# Patient Record
Sex: Female | Born: 1970 | Race: White | Hispanic: No | Marital: Married | Smoking: Never smoker
Health system: Southern US, Community
[De-identification: ages and names within clinical notes are randomized; demographics above are authoritative.]

## PROBLEM LIST (undated history)

## (undated) DIAGNOSIS — F32A Depression, unspecified: Secondary | ICD-10-CM

## (undated) DIAGNOSIS — F419 Anxiety disorder, unspecified: Secondary | ICD-10-CM

## (undated) DIAGNOSIS — A6000 Herpesviral infection of urogenital system, unspecified: Secondary | ICD-10-CM

## (undated) DIAGNOSIS — F329 Major depressive disorder, single episode, unspecified: Secondary | ICD-10-CM

## (undated) HISTORY — DX: Anxiety disorder, unspecified: F41.9

## (undated) HISTORY — DX: Herpesviral infection of urogenital system, unspecified: A60.00

## (undated) HISTORY — PX: BACK SURGERY: SHX140

## (undated) HISTORY — DX: Major depressive disorder, single episode, unspecified: F32.9

## (undated) HISTORY — DX: Depression, unspecified: F32.A

---

## 2006-06-21 HISTORY — PX: ABDOMINAL HYSTERECTOMY: SHX81

## 2014-07-09 LAB — HM PAP SMEAR: HM Pap smear: NEGATIVE

## 2014-07-30 ENCOUNTER — Other Ambulatory Visit: Payer: BLUE CROSS/BLUE SHIELD

## 2014-07-30 ENCOUNTER — Encounter: Payer: Self-pay | Admitting: General Surgery

## 2014-07-30 ENCOUNTER — Ambulatory Visit (INDEPENDENT_AMBULATORY_CARE_PROVIDER_SITE_OTHER): Payer: BLUE CROSS/BLUE SHIELD | Admitting: General Surgery

## 2014-07-30 VITALS — BP 110/60 | HR 88 | Resp 12 | Ht 63.0 in | Wt 125.0 lb

## 2014-07-30 DIAGNOSIS — N6310 Unspecified lump in the right breast, unspecified quadrant: Secondary | ICD-10-CM

## 2014-07-30 DIAGNOSIS — N63 Unspecified lump in breast: Secondary | ICD-10-CM

## 2014-07-30 NOTE — Patient Instructions (Signed)
Continue self breast exams. Call office for any new breast issues or concerns. 

## 2014-07-30 NOTE — Progress Notes (Signed)
Patient ID: Sarah Petty, female   DOB: October 14, 1970, 44 y.o.   MRN: [id number]  Chief Complaint  Patient presents with  . Breast Problem    HPI Sarah Petty is a 44 y.o. female.  who presents for a breast evaluation. The most recent mammogram was done on 2014 in [place].  Patient does perform regular self breast checks and gets regular mammograms done. No family history of breast cancer. She just moved here in August 2015 from [place]. The patient is working at the [employer] in the [place] of the [place].  She states she can feel a lump in the right breast. She first noticed it about 1-2 months ago. It has went down in size. Started as "pea" size then got as large as "marble" now back down to "pea" size.  Both breast are tender occasionally, with occasional shooting pains in the right breast. She did have a "sore" on her right nipple around 2010 that they tried to "freeze" but was unsuccessful. It has been present since that time but has not increased in size. She reports she will occasionally have tenderness in the left nipple/areola. She has inverted nipples that made nursing difficult but did not prevent her from "pumping"..  She does admit to having had mastitis during postpartum time period.  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 Anxiety   . Depression     Past Surgical History  Procedure Laterality Date  . Abdominal hysterectomy    . Back surgery  2010, 2013, 2014    No family history on file.  Social History History  Substance Use Topics  . Smoking status: Never Smoker   . Smokeless tobacco: Not on file  . Alcohol Use: No    Allergies  Allergen Reactions  . Codeine Nausea And Vomiting    Current Outpatient Prescriptions  Medication Sig Dispense Refill  . buPROPion (WELLBUTRIN XL) 150 MG 24 hr tablet Take 150 mg by mouth daily.  0  .  DULoxetine (CYMBALTA) 60 MG capsule   0  . traZODone (DESYREL) 100 MG tablet   0   No current facility-administered medications for this visit.    Review of Systems Review of Systems  Constitutional: Negative.   Respiratory: Negative.   Cardiovascular: Negative.     Blood pressure 110/60, pulse 88, resp. rate 12, height  (1.6 m), weight 125 lb (56.7 kg).  Physical Exam Physical Exam  Constitutional: She is oriented to person, place, and time. She appears well-developed and well-nourished.  Neck: Neck supple.  Cardiovascular: Normal rate, regular rhythm and normal heart sounds.   Pulmonary/Chest: Effort normal and breath sounds normal. Right breast exhibits inverted nipple and mass. Right breast exhibits no nipple discharge, no skin change and no tenderness. Left breast exhibits inverted nipple. Left breast exhibits no mass, no nipple discharge, no skin change and no tenderness.    Right areolar 3 mm nodular area.  Right breast 9 o'clock 3 CFN palpable 8 mm nodule.  Lymphadenopathy:    She has no cervical adenopathy.    She has no axillary adenopathy.  Neurological: She is alert and oriented to person, place, and time.  Skin: Skin is warm and dry.    Data Reviewed Her 2014 mammograms were reviewed. Very dense breasts are noted. No discernable abnormality.  Ultrasound examination of the right breast was completed because of the palpable  mass. At the 9:00 position 3 cm from the nipple a 0.75 x 1.22 x 1.23 soft lobulated mass with posterior acoustic enhancement was noted. This is felt to be representative of a fibroadenoma. At the 10:00 position 4 cm from the nipple a simple cyst measuring 0.61 cm in greatest diameter was noted. At the 3:00 position 4 cm from the nipple Petty additional simple cyst measuring 0.47 m in greatest diameter was noted. BI-RADS-3.  Assessment    Right breast mass, likely fibroadenoma. 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    Plan     The patient's asymptomatic in regards to the breast lesion and the option of Serzone whether she has a core biopsy to confirm the clinical impression of a fibroadenoma or undergoes excision considering a superficial location. I would recommend a punch biopsy of the chronic ulcer area due to the long-standing nature of the lesion.    Discussed right breast biopsy and excision right nipple lesion. This could be completed as Petty office procedure.  PCP: Dr. Marcello FennelHande Ref: Dr. Arvil ChacoVan Dalen   Earline MayotteByrnett, Brennyn Ortlieb W 07/31/2014, 4:37 PM

## 2014-07-31 DIAGNOSIS — N6310 Unspecified lump in the right breast, unspecified quadrant: Secondary | ICD-10-CM | POA: Insufficient documentation

## 2014-08-07 ENCOUNTER — Encounter: Payer: Self-pay | Admitting: General Surgery

## 2014-08-13 ENCOUNTER — Other Ambulatory Visit: Payer: BLUE CROSS/BLUE SHIELD

## 2014-08-13 ENCOUNTER — Ambulatory Visit (INDEPENDENT_AMBULATORY_CARE_PROVIDER_SITE_OTHER): Payer: BLUE CROSS/BLUE SHIELD | Admitting: General Surgery

## 2014-08-13 ENCOUNTER — Encounter: Payer: Self-pay | Admitting: General Surgery

## 2014-08-13 VITALS — BP 120/72 | HR 74 | Resp 14 | Ht 63.0 in | Wt 127.0 lb

## 2014-08-13 DIAGNOSIS — N6489 Other specified disorders of breast: Secondary | ICD-10-CM

## 2014-08-13 DIAGNOSIS — N6310 Unspecified lump in the right breast, unspecified quadrant: Secondary | ICD-10-CM

## 2014-08-13 DIAGNOSIS — N649 Disorder of breast, unspecified: Secondary | ICD-10-CM

## 2014-08-13 DIAGNOSIS — N63 Unspecified lump in breast: Secondary | ICD-10-CM

## 2014-08-13 HISTORY — PX: BREAST EXCISIONAL BIOPSY: SUR124

## 2014-08-13 NOTE — Progress Notes (Signed)
Patient ID: Sarah FortsMichelle P Schatzman, female   DOB: [birth year], 44 y.o.   MRN: [id number]  Chief Complaint  Patient presents with  . Procedure    right breast biopsy    HPI Sarah Petty is a 44 y.o. female.  Here today for excision of the right areolar lesion and the breast mass at the 9:00 position. HPI  Past Medical History  Diagnosis Date  . Anxiety   . Depression     Past Surgical History  Procedure Laterality Date  . Abdominal hysterectomy    . Back surgery  2010, 2013, 2014    No family history on file.  Social History History  Substance Use Topics  . Smoking status: Never Smoker   . Smokeless tobacco: Not on file  . Alcohol Use: No    Allergies  Allergen Reactions  . Codeine Nausea And Vomiting    Current Outpatient Prescriptions  Medication Sig Dispense Refill  . buPROPion (WELLBUTRIN XL) 150 MG 24 hr tablet Take 150 mg by mouth daily.  0  . DULoxetine (CYMBALTA) 60 MG capsule   0  . traZODone (DESYREL) 100 MG tablet Take 100 mg by mouth at bedtime.      No current facility-administered medications for this visit.    Review of Systems Review of Systems  Constitutional: Negative.   Respiratory: Negative.   Cardiovascular: Negative.     Blood pressure 120/72, pulse 74, resp. rate 14, height 5\' 3"  (1.6 m), weight 127 lb (57.607 kg).  Physical Exam Physical Exam These ulcerated area previously noted at the 1:00 position of the base the nipple has scabbed over. This is about 4 mm in diameter. The mass in the 9:00 position was confirmed by ultrasound prior to excision.  Assessment    Right breast fibroadenoma, chronic lesion of the right nipple.    Plan    The areas prepped with alcohol and 10 mL of 0.5% Xylocaine with 0.25% Marcaine with 1-200,000 epinephrine was utilized well tolerated. This was supplemented with 3 mL of 1% plain Xylocaine. The lesion at the 9:00 position of the right breast was excised first. A radial incision was made over the mass.  A 1 x 1 x 2 cm mass of thickened breast tissue was removed and it just medial to this a 6 mm cystic lesion excised. This was sent in formalin for routine histology. The wound was closed with 3-0 Vicryl figure-of-eight sutures to the deep layer and an intermittent layer of 4-0 nylon sutures for the skin.  Attention was turned to the area of the nipple. This was excised elliptical incision with the defect closed by interrupted 4-0 nylon sutures. Telfa and Tegaderm dressings were applied. The procedure was well tolerated. Wound care instructions were reviewed. She will follow-up in approximately 10 days with a nurse for suture removal. The delay is necessary as she is scheduled [facility] February [facility].     PCP:  Romilda JoyHande, Vishwanath  Berdie Malter W 08/14/2014, 7:00 PM

## 2014-08-13 NOTE — Patient Instructions (Signed)

## 2014-08-14 DIAGNOSIS — N649 Disorder of breast, unspecified: Secondary | ICD-10-CM | POA: Insufficient documentation

## 2014-08-16 ENCOUNTER — Telehealth: Payer: Self-pay

## 2014-08-16 NOTE — Telephone Encounter (Signed)
-----   Message from Earline MayotteJeffrey W Byrnett, MD sent at 08/16/2014  1:24 PM EST ----- Please notify the patient the skin lesion from the right areola as well as the nodule removed from the right breast were benign.   ----- Message -----    From: Lab in [facility] Interface    Sent: 08/15/2014   9:52 AM      To: Earline MayotteJeffrey W Byrnett, MD

## 2014-08-18 ENCOUNTER — Encounter: Payer: Self-pay | Admitting: General Surgery

## 2014-08-18 NOTE — Progress Notes (Signed)
Records from [facility] from 2011 through 2015 were reviewed. No notation regarding previous cryotherapy to the lesion of the right areola.  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  Rare, intermittent mention of the right nipple lesion on review of the above-mentioned records.

## 2014-08-19 NOTE — Telephone Encounter (Signed)
Notified patient as instructed, patient pleased. Discussed follow-up appointments, patient agrees  

## 2014-08-23 ENCOUNTER — Ambulatory Visit (INDEPENDENT_AMBULATORY_CARE_PROVIDER_SITE_OTHER): Payer: BLUE CROSS/BLUE SHIELD | Admitting: *Deleted

## 2014-08-23 DIAGNOSIS — N63 Unspecified lump in breast: Secondary | ICD-10-CM

## 2014-08-23 DIAGNOSIS — N6310 Unspecified lump in the right breast, unspecified quadrant: Secondary | ICD-10-CM

## 2014-08-23 NOTE — Progress Notes (Signed)
Patient here today for follow up post right breast biopsy. The sutures were removed and steri strips applied.Minimal bruising noted.  The patient is aware that a heating pad may be used for comfort as needed.  Aware of pathology. Follow up as scheduled. 

## 2016-06-21 DIAGNOSIS — A6000 Herpesviral infection of urogenital system, unspecified: Secondary | ICD-10-CM

## 2016-06-21 HISTORY — DX: Herpesviral infection of urogenital system, unspecified: A60.00

## 2016-06-22 ENCOUNTER — Encounter: Payer: Self-pay | Admitting: *Deleted

## 2016-06-22 ENCOUNTER — Emergency Department
Admission: EM | Admit: 2016-06-22 | Discharge: 2016-06-22 | Disposition: A | Discharge status: Home / Self Care | Payer: BLUE CROSS/BLUE SHIELD | Attending: Emergency Medicine | Admitting: Emergency Medicine

## 2016-06-22 DIAGNOSIS — S51811A Laceration without foreign body of right forearm, initial encounter: Secondary | ICD-10-CM | POA: Diagnosis not present

## 2016-06-22 DIAGNOSIS — Y939 Activity, unspecified: Secondary | ICD-10-CM | POA: Diagnosis not present

## 2016-06-22 DIAGNOSIS — Y929 Unspecified place or not applicable: Secondary | ICD-10-CM | POA: Diagnosis not present

## 2016-06-22 DIAGNOSIS — S6991XA Unspecified injury of right wrist, hand and finger(s), initial encounter: Secondary | ICD-10-CM | POA: Diagnosis present

## 2016-06-22 DIAGNOSIS — Z23 Encounter for immunization: Secondary | ICD-10-CM | POA: Diagnosis not present

## 2016-06-22 DIAGNOSIS — W010XXA Fall on same level from slipping, tripping and stumbling without subsequent striking against object, initial encounter: Secondary | ICD-10-CM | POA: Diagnosis not present

## 2016-06-22 DIAGNOSIS — Y999 Unspecified external cause status: Secondary | ICD-10-CM | POA: Insufficient documentation

## 2016-06-22 MED ORDER — LIDOCAINE HCL (PF) 1 % IJ SOLN
15.0000 mL | Freq: Once | INTRAMUSCULAR | Status: DC
  Filled 2016-06-22: qty 15

## 2016-06-22 MED ORDER — TETANUS-DIPHTH-ACELL PERTUSSIS 5-2.5-18.5 LF-MCG/0.5 IM SUSP
0.5000 mL | Freq: Once | INTRAMUSCULAR | Status: AC
  Administered 2016-06-22: 0.5 mL via INTRAMUSCULAR
  Filled 2016-06-22: qty 0.5

## 2016-06-22 NOTE — ED Provider Notes (Signed)
[facility] Emergency Department Provider Note  ____________________________________________  Time seen: Approximately 3:45 PM  I have reviewed the triage vital signs and the nursing notes.   HISTORY  Chief Complaint Laceration    HPI Sarah Petty is a 46 y.o. female who presents emergency department complaining of a laceration to right wrist. Patient states that she had developed a wooden lost for her sign and had an exposed nail. Patient states that she lacerated her wrist with the nail. Patient is unsure of her last tetanus shot. She reports full range of motion and sensation 5 digits of the right hand. No other injury or complaint. Bleeding was easily controlled with pressure. Patient states that she initially presented to an urgent care and was referred to the emergency department for further evaluation and management.   Past Medical History:  Diagnosis Date  . Anxiety   . Depression     Patient Active Problem List   Diagnosis Date Noted  . Nipple lesion 08/14/2014  . Breast mass, right 07/31/2014    Past Surgical History:  Procedure Laterality Date  . ABDOMINAL HYSTERECTOMY    . BACK SURGERY  2010, 2013, 2014    Prior to Admission medications   Medication Sig Start Date End Date Taking? Authorizing Provider  buPROPion (WELLBUTRIN XL) 150 MG 24 hr tablet Take 150 mg by mouth daily. 07/24/14   Historical Provider, MD  DULoxetine (CYMBALTA) 60 MG capsule  07/27/14   Historical Provider, MD  traZODone (DESYREL) 100 MG tablet Take 100 mg by mouth at bedtime.  08/12/14   Historical Provider, MD    Allergies Codeine  History reviewed. No pertinent family history.  Social History Social History  Substance Use Topics  . Smoking status: Never Smoker  . Smokeless tobacco: Not on file  . Alcohol use No     Review of Systems  Constitutional: No fever/chills Cardiovascular: no chest pain. Respiratory: no cough. No SOB. Musculoskeletal:  Negative for musculoskeletal pain. Skin: Positive for deep laceration to the right lateral wrist. Neurological: Negative for headaches, focal weakness or numbness. 10-point ROS otherwise negative.  ____________________________________________   PHYSICAL EXAM:  VITAL SIGNS: ED Triage Vitals  Enc Vitals Group     BP 06/22/16 1413 (!) 128/34     Pulse Rate 06/22/16 1413 99     Resp 06/22/16 1413 18     Temp 06/22/16 1413 97.9 F (36.6 C)     Temp Source 06/22/16 1413 Oral     SpO2 06/22/16 1413 96 %     Weight 06/22/16 1413 133 lb (60.3 kg)     Height 06/22/16 1413 5\' 3"  (1.6 m)     Head Circumference --      Peak Flow --      Pain Score 06/22/16 1414 5     Pain Loc --      Pain Edu? --      Excl. in GC? --      Constitutional: Alert and oriented. Well appearing and in no acute distress. Eyes: Conjunctivae are normal. PERRL. EOMI. Head: Atraumatic.  Cardiovascular: Normal rate, regular rhythm. Normal S1 and S2.  Good peripheral circulation. Respiratory: Normal respiratory effort without tachypnea or retractions. Lungs CTAB. Good air entry to the bases with no decreased or absent breath sounds. Musculoskeletal: Full range of motion to all extremities. No gross deformities appreciated. Neurologic:  Normal speech and language. No gross focal neurologic deficits are appreciated.  Skin:  Skin is warm, dry and intact. No  rash noted. Jagged laceration noted to the right lateral wrist. Total length is approximately 7 cm. No visible foreign body. No bleeding at this time. Full range of motion to wrist and all digits distally. Refill intact 5 digits. Sensation intact 5 digits. Psychiatric: Mood and affect are normal. Speech and behavior are normal. Patient exhibits appropriate insight and judgement.   ____________________________________________   LABS (all labs ordered are listed, but only abnormal results are displayed)  Labs Reviewed - No data to  display ____________________________________________  EKG   ____________________________________________  RADIOLOGY   No results found.  ____________________________________________    PROCEDURES  Procedure(s) performed:    Marland KitchenMarland KitchenLaceration Repair Date/Time: 06/22/2016 3:51 PM Performed by: Gala Romney D Authorized by: Gala Romney D   Consent:    Consent obtained:  Verbal   Consent given by:  Patient   Risks discussed:  Infection and pain Anesthesia (see MAR for exact dosages):    Anesthesia method:  Local infiltration   Local anesthetic:  Lidocaine 1% w/o epi Laceration details:    Location:  Shoulder/arm   Shoulder/arm location:  R lower arm   Length (cm):  7   Depth (mm):  20 Repair type:    Repair type:  Intermediate Pre-procedure details:    Preparation:  Patient was prepped and draped in usual sterile fashion Exploration:    Hemostasis achieved with:  Direct pressure   Wound exploration: wound explored through full range of motion and entire depth of wound probed and visualized     Wound extent: no foreign bodies/material noted, no muscle damage noted, no nerve damage noted and no tendon damage noted     Contaminated: no   Treatment:    Area cleansed with:  Betadine   Amount of cleaning:  Extensive   Irrigation solution:  Sterile saline   Irrigation method:  Syringe Subcutaneous repair:    Suture size:  4-0   Suture material:  Vicryl   Suture technique:  Simple interrupted   Number of sutures:  3 Skin repair:    Repair method:  Sutures   Suture size:  4-0   Suture material:  Nylon   Suture technique:  Simple interrupted   Number of sutures:  17 Approximation:    Approximation:  Close Post-procedure details:    Dressing:  Tube gauze   Patient tolerance of procedure:  Tolerated well, no immediate complications       Medications  lidocaine (PF) (XYLOCAINE) 1 % injection 15 mL (not administered)  Tdap (BOOSTRIX) injection 0.5 mL  (0.5 mLs Intramuscular Given 06/22/16 1559)     ____________________________________________   INITIAL IMPRESSION / ASSESSMENT AND PLAN / ED COURSE  Pertinent labs & imaging results that were available during my care of the patient were reviewed by me and considered in my medical decision making (see chart for details).  Review of the [place] CSRS was performed in accordance of the [facility] prior to dispensing any controlled drugs.  Clinical Course     Patient's diagnosis is consistent with Forearm laceration. This causes described above. No indication for ligamentous or muscular injury. Patient tolerated procedure well. No complications. Wound care structures are given to patient. Patient may take Tylenol and Motrin as needed for any return of pain. Patient will follow-up with primary care in 7-10 days for suture removal.. Patient is given ED precautions to return to the ED for any worsening or new symptoms.     ____________________________________________  FINAL CLINICAL IMPRESSION(S) / ED DIAGNOSES  Final diagnoses:  Laceration of right forearm, initial encounter      NEW MEDICATIONS STARTED DURING THIS VISIT:  New Prescriptions   No medications on file        This chart was dictated using voice recognition software/Dragon. Despite best efforts to proofread, errors can occur which can change the meaning. Any change was purely unintentional.    Racheal Patches, PA-C 06/22/16 1701    Arnaldo Natal, MD 06/22/16 [phone]

## 2016-06-22 NOTE — ED Notes (Signed)
Pt reports she has a laceration on her right wrist - pt states she slipped and fell on a screw causing the laceration (approx 1 1/2" laceration with depth) - bleeding is controlled at this time

## 2016-06-22 NOTE — ED Triage Notes (Signed)
States she tripped on a 2x2 and arrives with a lac on her right wrist from a nail, bleeding controlled

## 2016-06-25 ENCOUNTER — Other Ambulatory Visit: Payer: Self-pay | Admitting: Obstetrics and Gynecology

## 2016-06-25 DIAGNOSIS — N63 Unspecified lump in unspecified breast: Secondary | ICD-10-CM

## 2016-07-05 ENCOUNTER — Inpatient Hospital Stay
Admission: RE | Admit: 2016-07-05 | Discharge: 2016-07-05 | Disposition: A | Discharge status: Home / Self Care | Payer: Self-pay | Source: Ambulatory Visit | Attending: *Deleted | Admitting: *Deleted

## 2016-07-05 ENCOUNTER — Other Ambulatory Visit: Payer: Self-pay | Admitting: *Deleted

## 2016-07-05 DIAGNOSIS — Z9289 Personal history of other medical treatment: Secondary | ICD-10-CM

## 2016-07-16 ENCOUNTER — Ambulatory Visit
Admission: RE | Admit: 2016-07-16 | Discharge: 2016-07-16 | Disposition: A | Discharge status: Home / Self Care | Payer: BLUE CROSS/BLUE SHIELD | Source: Ambulatory Visit | Attending: Obstetrics and Gynecology | Admitting: Obstetrics and Gynecology

## 2016-07-16 DIAGNOSIS — N63 Unspecified lump in unspecified breast: Secondary | ICD-10-CM

## 2016-07-16 DIAGNOSIS — N6321 Unspecified lump in the left breast, upper outer quadrant: Secondary | ICD-10-CM | POA: Diagnosis not present

## 2016-07-16 LAB — HM MAMMOGRAPHY

## 2016-08-04 ENCOUNTER — Other Ambulatory Visit: Payer: Self-pay | Admitting: Obstetrics and Gynecology

## 2016-08-04 DIAGNOSIS — N6321 Unspecified lump in the left breast, upper outer quadrant: Secondary | ICD-10-CM

## 2016-12-08 ENCOUNTER — Encounter: Payer: Self-pay | Admitting: Obstetrics and Gynecology

## 2016-12-08 ENCOUNTER — Ambulatory Visit (INDEPENDENT_AMBULATORY_CARE_PROVIDER_SITE_OTHER): Payer: BLUE CROSS/BLUE SHIELD | Admitting: Obstetrics and Gynecology

## 2016-12-08 VITALS — BP 138/80 | HR 74 | Ht 63.0 in | Wt 129.0 lb

## 2016-12-08 DIAGNOSIS — B9689 Other specified bacterial agents as the cause of diseases classified elsewhere: Secondary | ICD-10-CM

## 2016-12-08 DIAGNOSIS — N760 Acute vaginitis: Secondary | ICD-10-CM | POA: Diagnosis not present

## 2016-12-08 LAB — POCT WET PREP WITH KOH
Clue Cells Wet Prep HPF POC: POSITIVE
KOH PREP POC: POSITIVE — AB
TRICHOMONAS UA: NEGATIVE
YEAST WET PREP PER HPF POC: NEGATIVE

## 2016-12-08 MED ORDER — METRONIDAZOLE 500 MG PO TABS: ORAL_TABLET | ORAL | 0 refills | Status: DC

## 2016-12-08 NOTE — Progress Notes (Signed)
Chief Complaint  Patient presents with  . Vaginitis    vaginal odor x 1 week    HPI:      [name] is a 46 y.o. G2P1011 who LMP was No LMP recorded. Patient has had a hysterectomy., presents today for vaginal odor without increased d/c/ irritation for the past wk. She denies any LBP, belly pain, fevers, urin sx. No recent abx use. She has not used any meds to treat. No hx of BV in the past.     Patient Active Problem List   Diagnosis Date Noted  . Nipple lesion 08/14/2014  . Breast mass, right 07/31/2014    Family History  Problem Relation Age of Onset  . Hypertension Mother     Social History   Social History  . Marital status: Married    Spouse name: N/A  . Number of children: N/A  . Years of education: 9616   Occupational History  . Not on file.   Social History Main Topics  . Smoking status: Never Smoker  . Smokeless tobacco: Never Used  . Alcohol use No  . Drug use: No  . Sexual activity: Yes    Birth control/ protection: Surgical     Comment: hysterectomy   Other Topics Concern  . Not on file   Social History Narrative  . No narrative on file     Current Outpatient Prescriptions:  .  buPROPion (WELLBUTRIN XL) 150 MG 24 hr tablet, Take 150 mg by mouth daily., Disp: , Rfl: 0 .  DULoxetine (CYMBALTA) 60 MG capsule, , Disp: , Rfl: 0 .  meloxicam (MOBIC) 7.5 MG tablet, take 1 tablet by mouth once daily with food or milk, Disp: , Rfl: 0 .  metroNIDAZOLE (FLAGYL) 500 MG tablet, Take 2 tabs BID for 1 day, Disp: 4 tablet, Rfl: 0 .  traZODone (DESYREL) 100 MG tablet, Take 100 mg by mouth at bedtime. , Disp: , Rfl:   Review of Systems  Constitutional: Negative for fever.  Gastrointestinal: Negative for blood in stool, constipation, diarrhea, nausea and vomiting.  Genitourinary: Negative for dyspareunia, dysuria, flank pain, frequency, hematuria, urgency, vaginal bleeding, vaginal discharge and vaginal pain.  Musculoskeletal: Negative for back  pain.  Skin: Negative for rash.     OBJECTIVE:   Vitals:  BP 138/80 (BP Location: Left Arm, Patient Position: Sitting, Cuff Size: Normal)   Pulse 74   Ht 5\' 3"  (1.6 m)   Wt 129 lb (58.5 kg)   BMI 22.85 kg/m   Physical Exam  Constitutional: She is oriented to person, place, and time and well-developed, well-nourished, and in no distress. Vital signs are normal.  Genitourinary: Uterus normal, cervix normal, right adnexa normal, left adnexa normal and vulva normal. Uterus is not enlarged. Cervix exhibits no motion tenderness and no tenderness. Right adnexum displays no mass and no tenderness. Left adnexum displays no mass and no tenderness. Vulva exhibits no erythema, no exudate, no lesion, no rash and no tenderness. Vagina exhibits no lesion. Thin  acrid  white and vaginal discharge found.  Neurological: She is oriented to person, place, and time.  Vitals reviewed.   Results: Results for orders placed or performed in visit on 12/08/16 (from the past 24 hour(s))  POCT Wet Prep with KOH     Status: Abnormal   Collection Time: 12/08/16 11:42 AM  Result Value Ref Range   Trichomonas, UA Negative    Clue Cells Wet Prep HPF POC pos    Epithelial Wet  Prep HPF POC  Few, Moderate, Many, Too numerous to count   Yeast Wet Prep HPF POC neg    Bacteria Wet Prep HPF POC  Few   RBC Wet Prep HPF POC     WBC Wet Prep HPF POC     KOH Prep POC Positive (A) Negative     Assessment/Plan: Bacterial vaginosis - Pos wet prep. Rx flagyl. Will RF if sx recur. F/u prn. - Plan: POCT Wet Prep with KOH, metroNIDAZOLE (FLAGYL) 500 MG tablet     Return if symptoms worsen or fail to improve.  Edel Rivero B. Kihanna Kamiya, PA-C 12/08/2016 11:43 AM

## 2016-12-20 ENCOUNTER — Telehealth: Payer: Self-pay

## 2016-12-20 ENCOUNTER — Telehealth: Payer: Self-pay | Admitting: Certified Nurse Midwife

## 2016-12-20 ENCOUNTER — Other Ambulatory Visit: Payer: Self-pay | Admitting: Certified Nurse Midwife

## 2016-12-20 MED ORDER — FLUCONAZOLE 150 MG PO TABS: ORAL_TABLET | ORAL | 0 refills | Status: DC

## 2016-12-20 MED ORDER — TINIDAZOLE 500 MG PO TABS: 1.0000 g | ORAL_TABLET | Freq: Every day | ORAL | 0 refills | Status: AC

## 2016-12-20 NOTE — Telephone Encounter (Signed)
Patient called regarding her return of BV symptoms. Was treated for BV with a one day course of Flagyl. Will try longer treatment. Tindamax 1gm daily x 5 days with food. No alcohol x 7 days. Diflucan also prescribed to prevent yeast.

## 2016-12-20 NOTE — Telephone Encounter (Signed)
Pt aware msg sent to [facility] as [facility] is out of town and unable to [employer] msgs. 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  Adv to change underwear 2-3x/d and be sure underwear has cotton crotch.  Pt aware her phone # given to [facility] in msg.

## 2016-12-20 NOTE — Telephone Encounter (Signed)
Pt had BV, given rx, it went away, yeast inf, tx'd, now believes BV is back b/c of the odor.  Would like rx for both.  [phone].

## 2016-12-20 NOTE — Telephone Encounter (Signed)
Patient called and RX e prescribed.

## 2017-01-17 ENCOUNTER — Ambulatory Visit: Payer: BLUE CROSS/BLUE SHIELD

## 2017-04-11 ENCOUNTER — Ambulatory Visit (INDEPENDENT_AMBULATORY_CARE_PROVIDER_SITE_OTHER): Payer: BLUE CROSS/BLUE SHIELD | Admitting: Obstetrics and Gynecology

## 2017-04-11 ENCOUNTER — Encounter: Payer: Self-pay | Admitting: Obstetrics and Gynecology

## 2017-04-11 DIAGNOSIS — N6320 Unspecified lump in the left breast, unspecified quadrant: Secondary | ICD-10-CM

## 2017-04-11 NOTE — Progress Notes (Addendum)
Chief Complaint  Patient presents with  . Breast Problem    Left Breast Lump has gotten larger    HPI:      [name] is a 46 y.o. G2P1011 who LMP was No LMP recorded. Patient has had a hysterectomy., presents today for f/u LT breast mass. She first noticed it 1/18 and was noted to have a probable LT breast fibroadenoma vs complicated cyst at 1:00 pos on mammo and u/s. Pt was to have f/u LT breast u/s 7/18 but never did. Pt has noted the mass has gotten bigger and easier to feel for the past 2-3 wks. No pain, redness, tenderness. She drinks caffeine daily. No trauma to the breast. No FH breast/ovar ca. She had a RT breast mass exc with Dr. Lemar Livings in the past.     Past Medical History:  Diagnosis Date  . Anxiety   . Depression   . 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 ABDOMINAL HYSTERECTOMY  2008  . BACK SURGERY  2010, 2013, 2014  . BREAST EXCISIONAL BIOPSY Right 08/13/2014   complex cyst excised done by byrnett     Family History  Problem Relation Age of Onset  . Hypertension Mother     Social History   Social History  . Marital status: Married    Spouse name: N/A  . Number of children: N/A  . Years of education: 36   Occupational History  . Not on file.   Social History Main Topics  . Smoking status: Never Smoker  . Smokeless tobacco: Never Used  . Alcohol use No  . Drug use: No  . Sexual activity: Yes    Birth control/ protection: Surgical     Comment: hysterectomy   Other Topics Concern  . Not on file   Social History Narrative  . No narrative on file     Current Outpatient Prescriptions:  .  buPROPion (WELLBUTRIN XL) 150 MG 24 hr tablet, Take 150 mg by mouth daily., Disp: , Rfl: 0 .  traZODone (DESYREL) 100 MG tablet, Take 100 mg by mouth at bedtime. , Disp: , Rfl:  .  DULoxetine (CYMBALTA) 60 MG capsule, , Disp: , Rfl: 0 .  fluconazole (DIFLUCAN) 150 MG tablet, Take one  tablet every 3 days x 2 doses (Patient not taking: Reported on 04/11/2017), Disp: 2 tablet, Rfl: 0 .  meloxicam (MOBIC) 7.5 MG tablet, take 1 tablet by mouth once daily with food or milk, Disp: , Rfl: 0 .  metroNIDAZOLE (FLAGYL) 500 MG tablet, Take 2 tabs BID for 1 day (Patient not taking: Reported on 04/11/2017), Disp: 4 tablet, Rfl: 0   ROS:  Review of Systems  Constitutional: Negative for fever.  Gastrointestinal: Negative for blood in stool, constipation, diarrhea, nausea and vomiting.  Genitourinary: Negative for dyspareunia, dysuria, flank pain, frequency, hematuria, urgency, vaginal bleeding, vaginal discharge and vaginal pain.  Musculoskeletal: Negative for back pain.  Skin: Negative for rash.  Breast ROS: positive for - new or changing breast lumps    OBJECTIVE:   Vitals:  BP 118/80 (BP Location: Left Arm, Patient Position: Sitting, Cuff Size: Normal)   Pulse 88   Ht 5\' 3"  (1.6 m)   Wt 128 lb (58.1 kg)   BMI 22.67 kg/m   Physical Exam  Constitutional: She is oriented to person, place, and time and well-developed, well-nourished, and in no distress.  Pulmonary/Chest: Right breast  exhibits inverted nipple. Right breast exhibits no mass, no nipple discharge, no skin change and no tenderness. Left breast exhibits inverted nipple and mass. Left breast exhibits no nipple discharge, no skin change and no tenderness. Breasts are symmetrical.    LT BREAT 1-2:00 POS WITH ~1 CM FIRM, MOBILE, NT MASS; PALPABLE BEST IN SITTING POSITION  Lymphadenopathy:    She has no axillary adenopathy.  Neurological: She is alert and oriented to person, place, and time.  Psychiatric: Affect and judgment normal.  Vitals reviewed.   Assessment/Plan: Left breast mass - Increasing in size per pt report. Check f/u breast u/s. Sched appt with Dr. Lemar LivingsByrnett for 2nd opinion anyway. - Plan: US BREAST LTD UNI LEFT INC AXILLA, Ambulatory referral to General Surgery, MM DIAG BREAST TOMO UNI LEFT  Pt loses  health ins 06/20/17.   Return if symptoms worsen or fail to improve.  Brittnay Pigman B. Favor Hackler, PA-C 04/12/2017 9:56 AM

## 2017-04-12 NOTE — Addendum Note (Signed)
Addended by: Althea GrimmerOPLAND, Trentan Trippe B on: 04/12/2017 09:57 AM   Modules accepted: Orders

## 2017-04-14 ENCOUNTER — Encounter: Payer: Self-pay | Admitting: *Deleted

## 2017-04-18 ENCOUNTER — Ambulatory Visit
Admission: RE | Admit: 2017-04-18 | Discharge: 2017-04-18 | Disposition: A | Discharge status: Home / Self Care | Payer: BLUE CROSS/BLUE SHIELD | Source: Ambulatory Visit | Attending: Obstetrics and Gynecology | Admitting: Obstetrics and Gynecology

## 2017-04-18 DIAGNOSIS — N6321 Unspecified lump in the left breast, upper outer quadrant: Secondary | ICD-10-CM | POA: Diagnosis not present

## 2017-04-18 DIAGNOSIS — N6320 Unspecified lump in the left breast, unspecified quadrant: Secondary | ICD-10-CM | POA: Diagnosis present

## 2017-04-20 ENCOUNTER — Ambulatory Visit: Payer: BLUE CROSS/BLUE SHIELD | Admitting: General Surgery

## 2017-05-04 ENCOUNTER — Encounter: Payer: Self-pay | Admitting: *Deleted

## 2017-06-09 ENCOUNTER — Emergency Department: Payer: BLUE CROSS/BLUE SHIELD

## 2017-06-09 ENCOUNTER — Other Ambulatory Visit: Payer: Self-pay

## 2017-06-09 ENCOUNTER — Emergency Department
Admission: EM | Admit: 2017-06-09 | Discharge: 2017-06-09 | Disposition: A | Discharge status: Home / Self Care | Payer: BLUE CROSS/BLUE SHIELD | Attending: Emergency Medicine | Admitting: Emergency Medicine

## 2017-06-09 DIAGNOSIS — N83201 Unspecified ovarian cyst, right side: Secondary | ICD-10-CM | POA: Insufficient documentation

## 2017-06-09 DIAGNOSIS — Z79899 Other long term (current) drug therapy: Secondary | ICD-10-CM | POA: Diagnosis not present

## 2017-06-09 DIAGNOSIS — N83209 Unspecified ovarian cyst, unspecified side: Secondary | ICD-10-CM

## 2017-06-09 DIAGNOSIS — R1031 Right lower quadrant pain: Secondary | ICD-10-CM | POA: Diagnosis present

## 2017-06-09 LAB — CBC
HCT: 43.6 % (ref 35.0–47.0)
Hemoglobin: 14.9 g/dL (ref 12.0–16.0)
MCH: 32.4 pg (ref 26.0–34.0)
MCHC: 34.2 g/dL (ref 32.0–36.0)
MCV: 94.7 fL (ref 80.0–100.0)
Platelets: 312 10*3/uL (ref 150–440)
RBC: 4.61 MIL/uL (ref 3.80–5.20)
RDW: 13 % (ref 11.5–14.5)
WBC: 10.2 10*3/uL (ref 3.6–11.0)

## 2017-06-09 LAB — COMPREHENSIVE METABOLIC PANEL
ALT: 12 U/L — ABNORMAL LOW (ref 14–54)
AST: 27 U/L (ref 15–41)
Albumin: 4.5 g/dL (ref 3.5–5.0)
Alkaline Phosphatase: 80 U/L (ref 38–126)
Anion gap: 13 (ref 5–15)
BUN: 14 mg/dL (ref 6–20)
CO2: 21 mmol/L — ABNORMAL LOW (ref 22–32)
Calcium: 9.7 mg/dL (ref 8.9–10.3)
Chloride: 101 mmol/L (ref 101–111)
Creatinine, Ser: 0.78 mg/dL (ref 0.44–1.00)
GFR calc Af Amer: >60 mL/min (ref >60)
GFR calc non Af Amer: >60 mL/min (ref >60)
Glucose, Bld: 107 mg/dL — ABNORMAL HIGH (ref 65–99)
Potassium: 3.7 mmol/L (ref 3.5–5.1)
Sodium: 135 mmol/L (ref 135–145)
Total Bilirubin: 0.9 mg/dL (ref 0.3–1.2)
Total Protein: 7.8 g/dL (ref 6.5–8.1)

## 2017-06-09 LAB — URINALYSIS, COMPLETE (UACMP) WITH MICROSCOPIC
Bacteria, UA: NONE SEEN
Bilirubin Urine: NEGATIVE
Glucose, UA: NEGATIVE mg/dL
Hgb urine dipstick: NEGATIVE
Ketones, ur: 20 mg/dL — AB
Leukocytes, UA: NEGATIVE
Nitrite: NEGATIVE
Protein, ur: NEGATIVE mg/dL
Specific Gravity, Urine: 1.011 (ref 1.005–1.030)
pH: 9 — ABNORMAL HIGH (ref 5.0–8.0)

## 2017-06-09 LAB — POCT PREGNANCY, URINE: Preg Test, Ur: NEGATIVE

## 2017-06-09 LAB — LIPASE, BLOOD: LIPASE: 28 U/L (ref 11–51)

## 2017-06-09 MED ORDER — FENTANYL CITRATE (PF) 100 MCG/2ML IJ SOLN
INTRAMUSCULAR | Status: AC
  Administered 2017-06-09: 50 ug via INTRAVENOUS
  Filled 2017-06-09: qty 2

## 2017-06-09 MED ORDER — IBUPROFEN 600 MG PO TABS: 600.0000 mg | ORAL_TABLET | Freq: Four times a day (QID) | ORAL | 0 refills | Status: DC | PRN

## 2017-06-09 MED ORDER — ONDANSETRON HCL 4 MG/2ML IJ SOLN
4.0000 mg | Freq: Once | INTRAMUSCULAR | Status: AC
  Administered 2017-06-09: 4 mg via INTRAVENOUS

## 2017-06-09 MED ORDER — IOPAMIDOL (ISOVUE-300) INJECTION 61%
75.0000 mL | Freq: Once | INTRAVENOUS | Status: AC | PRN
  Administered 2017-06-09: 75 mL via INTRAVENOUS

## 2017-06-09 MED ORDER — SODIUM CHLORIDE 0.9 % IV BOLUS (SEPSIS)
1000.0000 mL | Freq: Once | INTRAVENOUS | Status: AC
  Administered 2017-06-09: 1000 mL via INTRAVENOUS

## 2017-06-09 MED ORDER — ONDANSETRON HCL 4 MG/2ML IJ SOLN
INTRAMUSCULAR | Status: AC
  Administered 2017-06-09: 4 mg via INTRAVENOUS
  Filled 2017-06-09: qty 2

## 2017-06-09 MED ORDER — MORPHINE SULFATE (PF) 4 MG/ML IV SOLN
4.0000 mg | Freq: Once | INTRAVENOUS | Status: AC
  Administered 2017-06-09: 4 mg via INTRAVENOUS

## 2017-06-09 MED ORDER — METOCLOPRAMIDE HCL 5 MG/ML IJ SOLN
10.0000 mg | Freq: Once | INTRAMUSCULAR | Status: AC
  Administered 2017-06-09: 10 mg via INTRAVENOUS
  Filled 2017-06-09: qty 2

## 2017-06-09 MED ORDER — MORPHINE SULFATE (PF) 4 MG/ML IV SOLN
INTRAVENOUS | Status: AC
  Filled 2017-06-09: qty 1

## 2017-06-09 MED ORDER — FENTANYL CITRATE (PF) 100 MCG/2ML IJ SOLN
50.0000 ug | Freq: Once | INTRAMUSCULAR | Status: AC
  Administered 2017-06-09: 50 ug via INTRAVENOUS

## 2017-06-09 NOTE — ED Notes (Signed)
Pt back from US

## 2017-06-09 NOTE — ED Provider Notes (Signed)
[facility] Emergency Department Provider Note ____________________________________________   First MD Initiated Contact with Patient 06/09/17 1927     (approximate)  I have reviewed the triage vital signs and the nursing notes.   HISTORY  Chief Complaint Abdominal Pain (RLQ)    HPI Sarah Petty is a 46 y.o. female with past medical history as noted below and who is status post hysterectomy who presents with acute onset of right lower quadrant abdominal pain approximately 12 hours ago, with persistent course throughout the day, associated with nausea but no vomiting, and not associated with urinary symptoms, fever, vaginal bleeding or discharge, or diarrhea.  No prior history of this pain.  Patient states she is sexually active and has had multiple partners over the last few months, however she states she is diligent about using protection, and has had no discharge.    Past Medical History:  Diagnosis Date  . Anxiety   . Depression   . 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 Left breast mass 04/11/2017  . Nipple lesion 08/14/2014  . Breast mass, right 07/31/2014    Past Surgical History:  Procedure Laterality Date  . ABDOMINAL HYSTERECTOMY  2008  . BACK SURGERY  2010, 2013, 2014  . BREAST EXCISIONAL BIOPSY Right 08/13/2014   complex cyst excised done by byrnett     Prior to Admission medications   Medication Sig Start Date End Date Taking? Authorizing Provider  buPROPion (WELLBUTRIN XL) 150 MG 24 hr tablet Take 150 mg by mouth daily. 07/24/14   [provider]  DULoxetine (CYMBALTA) 60 MG capsule  07/27/14   [provider]  fluconazole (DIFLUCAN) 150 MG tablet Take one tablet every 3 days x 2 doses Patient not taking: Reported on 04/11/2017 12/20/16   Farrel ConnersGutierrez, Colleen, CNM  meloxicam (MOBIC) 7.5 MG tablet take 1 tablet by mouth once daily with food or milk  11/29/16   [provider]  metroNIDAZOLE (FLAGYL) 500 MG tablet Take 2 tabs BID for 1 day Patient not taking: Reported on 04/11/2017 12/08/16   Copland, Ilona SorrelAlicia B, PA-C  traZODone (DESYREL) 100 MG tablet Take 100 mg by mouth at bedtime.  08/12/14   [provider]    Allergies Codeine  Family History  Problem Relation Age of Onset  . Hypertension Mother   . Breast cancer Neg Hx     Social History Social History   Tobacco Use  . Smoking status: Never Smoker  . Smokeless tobacco: Never Used  Substance Use Topics  . Alcohol use: No    Alcohol/week: 0.0 oz  . Drug use: No    Review of Systems  Constitutional: No fever.  Eyes: No visual changes. ENT: No sore throat. Cardiovascular: Denies chest pain. Respiratory: Denies shortness of breath. Gastrointestinal: Positive for nausea, no vomiting. Genitourinary: Negative for dysuria.  Musculoskeletal: Negative for back pain. Skin: Negative for rash. Neurological: Negative for headache.    ____________________________________________   PHYSICAL EXAM:  VITAL SIGNS: ED Triage Vitals  Enc Vitals Group     BP 06/09/17 1641 124/60     Pulse Rate 06/09/17 1641 80     Resp 06/09/17 1641 (!) 22     Temp 06/09/17 1641 98 F (36.7 C)     Temp Source 06/09/17 1641 Oral     SpO2 06/09/17 1641 99 %     Weight 06/09/17 1642 128 lb (58.1  kg)     Height 06/09/17 1642 5\' 3"  (1.6 m)     Head Circumference --      Peak Flow --      Pain Score 06/09/17 1641 7     Pain Loc --      Pain Edu? --      Excl. in GC? --     Constitutional: Alert and oriented.  Slightly uncomfortable appearing but in no acute distress. Eyes: Conjunctivae are normal.  No scleral icterus. Head: Atraumatic. Nose: No congestion/rhinnorhea. Mouth/Throat: Mucous membranes are moist.   Neck: Normal range of motion.  Cardiovascular:  Good peripheral circulation. Respiratory: Normal respiratory effort.  No retractions.  Gastrointestinal: Soft  with moderate right lower quadrant tenderness. No distention.  Genitourinary: No CVA tenderness. Musculoskeletal: No lower extremity edema.  Extremities warm and well perfused.  Neurologic:  Normal speech and language. No gross focal neurologic deficits are appreciated.  Skin:  Skin is warm and dry. No rash noted. Psychiatric: Mood and affect are normal. Speech and behavior are normal.  ____________________________________________   LABS (all labs ordered are listed, but only abnormal results are displayed)  Labs Reviewed  COMPREHENSIVE METABOLIC PANEL - Abnormal; Notable for the following components:      Result Value   CO2 21 (*)    Glucose, Bld 107 (*)    ALT 12 (*)    All other components within normal limits  URINALYSIS, COMPLETE (UACMP) WITH MICROSCOPIC - Abnormal; Notable for the following components:   Color, Urine YELLOW (*)    APPearance CLEAR (*)    pH 9.0 (*)    Ketones, ur 20 (*)    Squamous Epithelial / LPF 0-5 (*)    All other components within normal limits  LIPASE, BLOOD  CBC  POC URINE PREG, ED  POCT PREGNANCY, URINE   ____________________________________________  EKG   ____________________________________________  RADIOLOGY  CT abdomen: Small right ovarian cyst with free fluid, suggestive of ruptured cyst.  Normal appendix.  Pelvic ultrasound:  ____________________________________________   PROCEDURES  Procedure(s) performed: No    Critical Care performed: No ____________________________________________   INITIAL IMPRESSION / ASSESSMENT AND PLAN / ED COURSE  Pertinent labs & imaging results that were available during my care of the patient were reviewed by me and considered in my medical decision making (see chart for details).  46 year old female with past medical history as noted above presents with acute onset right lower quadrant pain for approximately last 12 hours with nausea but no significant associated symptoms.  On exam,  vital signs are normal, the patient is slightly uncomfortable but not ill-appearing, and there is moderate tenderness in the right lower quadrant.  Past medical records were reviewed in epic and are noncontributory.  Differential includes acute appendicitis, colitis, diverticulitis, or pelvic etiology such as ruptured ovarian cyst or less likely ovarian torsion.  Given the patient is status post hysterectomy, there is no possibility of fibroid or PID.  I offered to perform a pelvic exam if patient would prefer, however she declined.  ----------------------------------------- 7:56 PM on 06/09/2017 -----------------------------------------  CT shows normal appendix, and reveals findings suggestive of ruptured ovarian cyst.  I will obtain a pelvic ultrasound to confirm and rule out any additional pathology in the pelvis.  Anticipate discharge home if the ultrasound confirms no other acute findings.    ----------------------------------------- 9:19 PM on 06/09/2017 -----------------------------------------  Ultrasound confirms findings consistent with ruptured ovarian cyst, with no other acute findings.  Patient reports improved pain, and appears  much more comfortable.  Vital signs are stable.  The patient feels well and would like to go home.  Discharge instructions and return precautions given, patient expresses understanding.  ____________________________________________   FINAL CLINICAL IMPRESSION(S) / ED DIAGNOSES  Final diagnoses:  Ovarian cyst  Ruptured ovarian cyst      NEW MEDICATIONS STARTED DURING THIS VISIT:  This SmartLink is deprecated. Use AVSMEDLIST instead to display the medication list for a patient.   Note:  This document was prepared using Dragon voice recognition software and may include unintentional dictation errors.    Dionne Bucy, MD 06/09/17 2119

## 2017-06-09 NOTE — ED Notes (Signed)
Patient transported to Ultrasound 

## 2017-06-09 NOTE — ED Notes (Signed)
  Esign not working. Pt verbalized discharge instructions and has no questions at this time. 

## 2017-06-09 NOTE — Discharge Instructions (Signed)
Return to the emergency department for new, worsening, or persistent severe abdominal pain, vomiting, weakness, lightheadedness, or fever, or any other new or worsening symptoms that concern you.

## 2017-06-09 NOTE — ED Triage Notes (Signed)
Pt arrives to ED with c/o of RLQ abd pain that began at 6am today. States nausea. Appears uncomfortable. No vomiting, diarrhea, or fevers. Alert oriented, in wheelchair.

## 2018-01-23 IMAGING — CT CT ABD-PELV W/ CM
2 of 5 series · 16 of 46 positions shown, 18 images · IV contrast (iopamidol)
Comparison: None

CLINICAL DATA: RIGHT lower quadrant pain since 8988 hours today,
nausea, question appendicitis

EXAM:
CT ABDOMEN AND PELVIS WITH CONTRAST
TECHNIQUE: Multidetector CT imaging of the abdomen and pelvis was performed
using the standard protocol following bolus administration of
intravenous contrast. Sagittal and coronal MPR images reconstructed
from axial data set.
CONTRAST:  75mL WBP2RD-3YY IOPAMIDOL (WBP2RD-3YY) INJECTION 61% IV.
No oral contrast.

[Series 2: axial (person_name) (person_name) · axial · 0.61mm/px · z∈[-660,-276]mm · 13 of 87 slices shown, 15 images]
[im 5/87  soft-tissue]
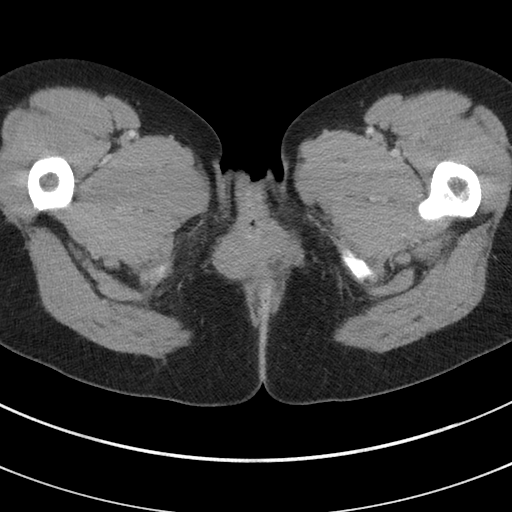
[im 5/87  bone]
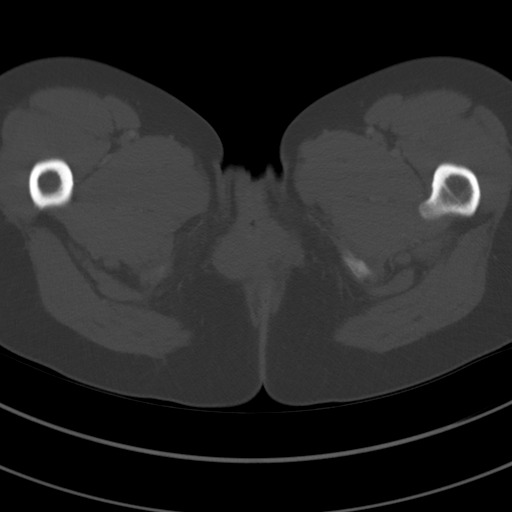
[im 14/87  soft-tissue]
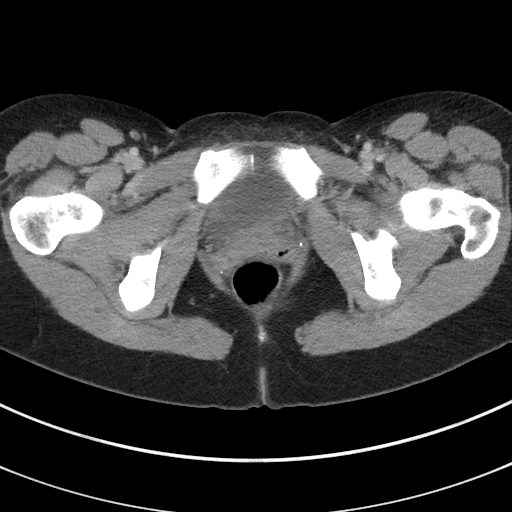
[im 19/87  soft-tissue]
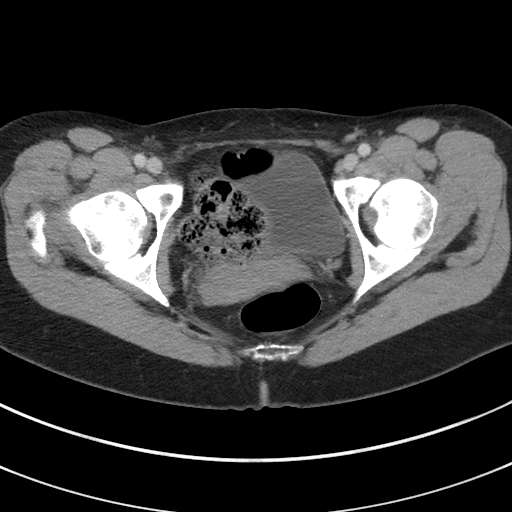
[im 23/87  soft-tissue]
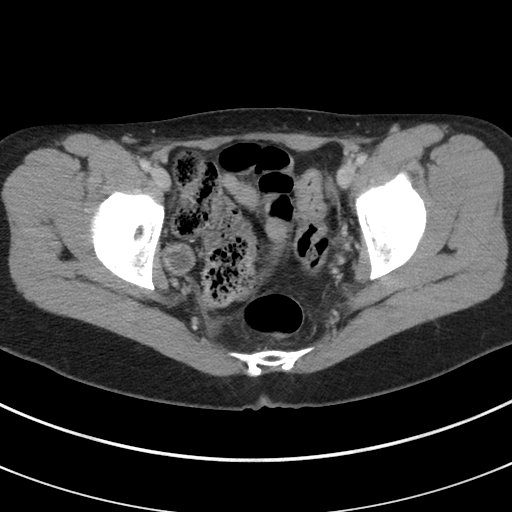
[im 32/87  soft-tissue]
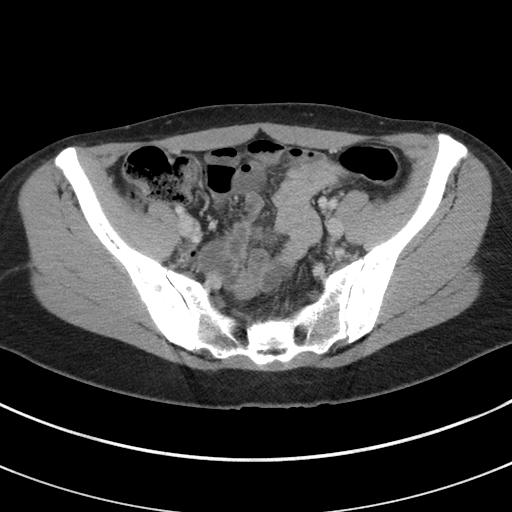
[im 37/87  soft-tissue]
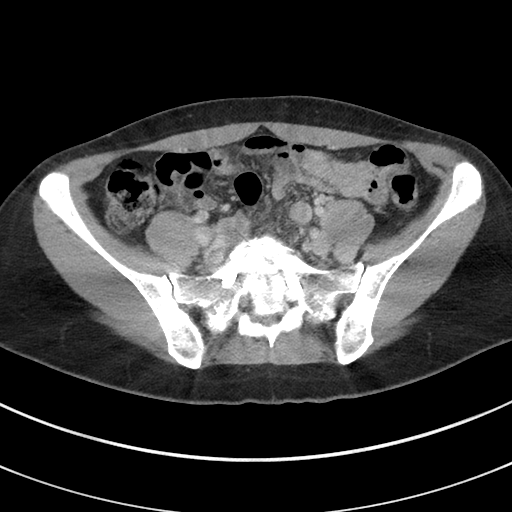
[im 46/87  soft-tissue]
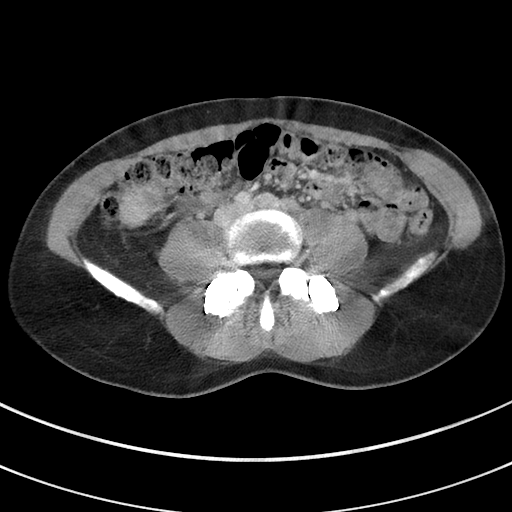
[im 50/87  soft-tissue]
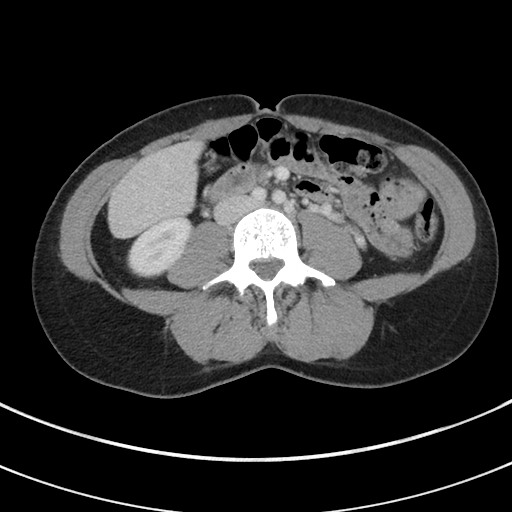
[im 55/87  soft-tissue]
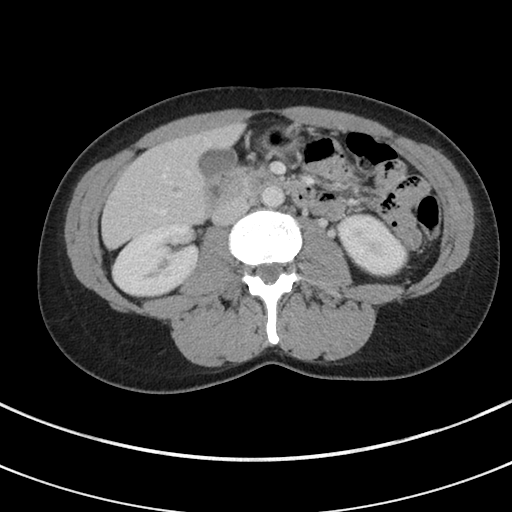
[im 55/87  bone]
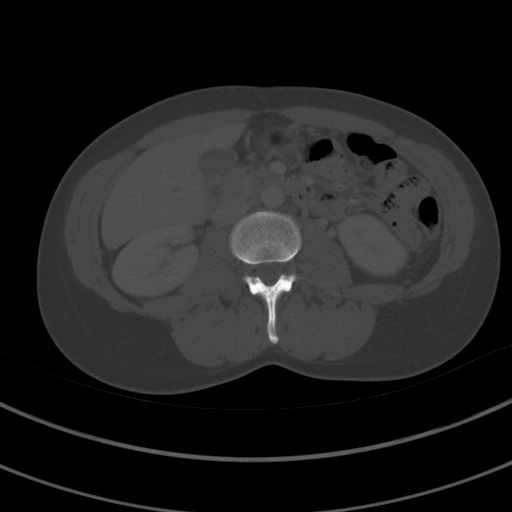
[im 64/87  soft-tissue]
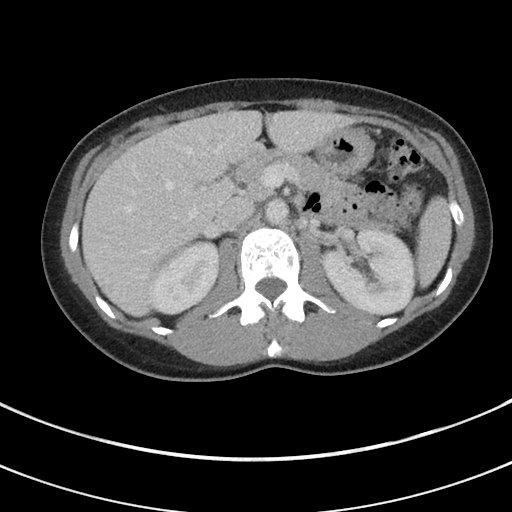
[im 68/87  soft-tissue]
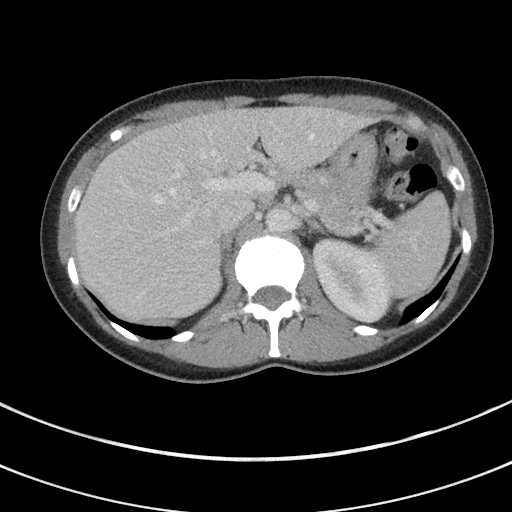
[im 73/87  soft-tissue]
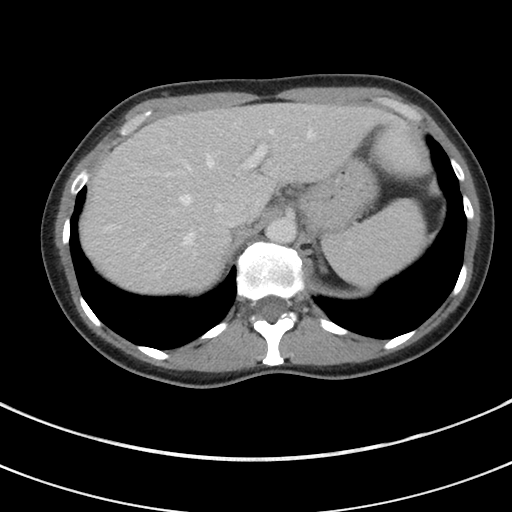
[im 82/87  soft-tissue]
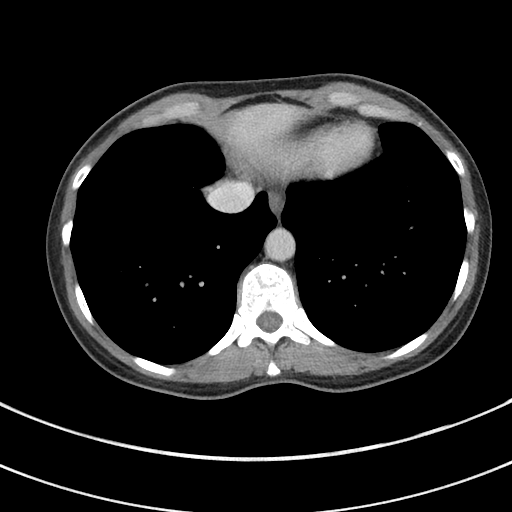

[Series 5: coronal st · coronal · 0.60mm/px · 3 of 61 slices shown]
[im 21/61  soft-tissue]
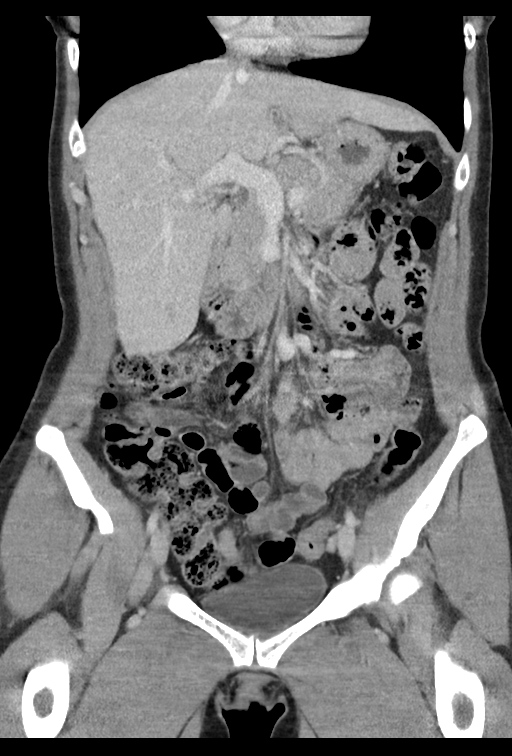
[im 27/61  soft-tissue]
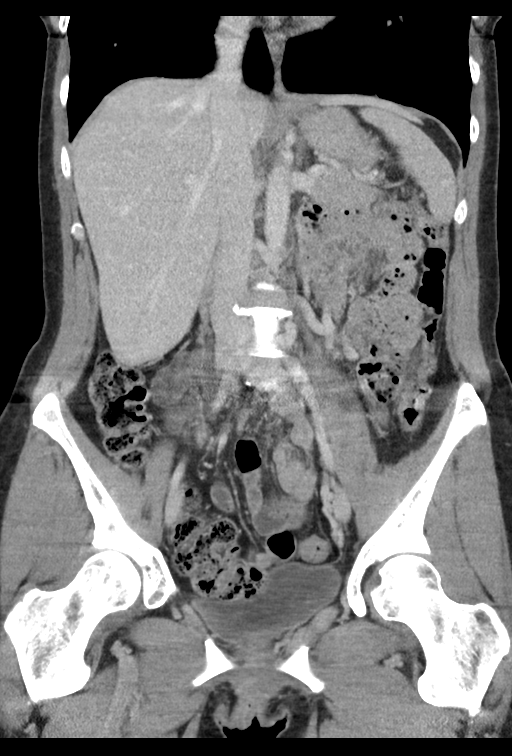
[im 34/61  soft-tissue]
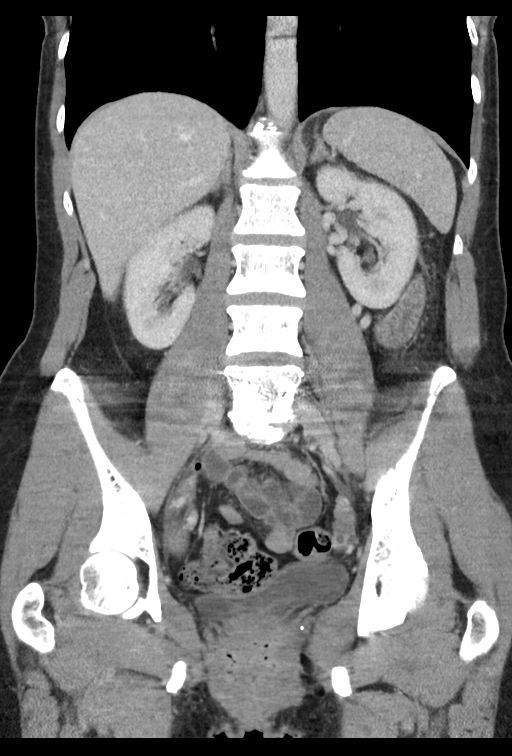

[16 of 46 positions shown; findings below may reference images not displayed]

FINDINGS: Lower chest: Lung bases clear

Hepatobiliary: Gallbladder and liver normal appearance

Pancreas: Normal appearance

Spleen: Normal appearance

Adrenals/Urinary Tract: Adrenal glands, kidneys, ureters, and
bladder normal appearance

Stomach/Bowel: Normal appendix in RIGHT pelvis cranial to the RIGHT
ovary. Low lying cecum in RIGHT pelvis. Large and small bowel loops
grossly unremarkable. Stomach decompressed, no gross abnormality
seen.

Vascular/Lymphatic: Vascular structures patent. Minimal
atherosclerotic calcification aorta. Few collaterals in the LEFT
retroperitoneum. No adenopathy.

Reproductive: Uterus surgically absent. LEFT ovary normal
appearance. RIGHT ovarian cyst 2.0 x 2.0 x 2.2 cm, minimally
irregular with a small amount of RIGHT free pelvic fluid, cannot
exclude ruptured cyst.

Other: No free air or additional free fluid.  No hernia.

Musculoskeletal: Prior L5-S1 fusion.
IMPRESSION: Normal appendix.

Small RIGHT ovarian cyst 2.0 x 2.0 x 2.2 cm with small amount of
associated free pelvic fluid in RIGHT pelvis, question ruptured
ovarian cyst.

## 2018-03-27 ENCOUNTER — Encounter: Payer: Self-pay | Admitting: Obstetrics and Gynecology

## 2018-03-27 ENCOUNTER — Other Ambulatory Visit (HOSPITAL_COMMUNITY)
Admission: RE | Admit: 2018-03-27 | Discharge: 2018-03-27 | Disposition: A | Discharge status: Home / Self Care | Payer: BLUE CROSS/BLUE SHIELD | Source: Ambulatory Visit | Attending: Obstetrics and Gynecology | Admitting: Obstetrics and Gynecology

## 2018-03-27 ENCOUNTER — Ambulatory Visit (INDEPENDENT_AMBULATORY_CARE_PROVIDER_SITE_OTHER): Payer: BLUE CROSS/BLUE SHIELD | Admitting: Obstetrics and Gynecology

## 2018-03-27 VITALS — BP 108/60 | HR 86 | Ht 63.0 in | Wt 123.0 lb

## 2018-03-27 DIAGNOSIS — Z01419 Encounter for gynecological examination (general) (routine) without abnormal findings: Secondary | ICD-10-CM

## 2018-03-27 DIAGNOSIS — R5383 Other fatigue: Secondary | ICD-10-CM

## 2018-03-27 DIAGNOSIS — Z1321 Encounter for screening for nutritional disorder: Secondary | ICD-10-CM

## 2018-03-27 DIAGNOSIS — Z1329 Encounter for screening for other suspected endocrine disorder: Secondary | ICD-10-CM

## 2018-03-27 DIAGNOSIS — Z113 Encounter for screening for infections with a predominantly sexual mode of transmission: Secondary | ICD-10-CM

## 2018-03-27 DIAGNOSIS — Z1239 Encounter for other screening for malignant neoplasm of breast: Secondary | ICD-10-CM

## 2018-03-27 NOTE — Progress Notes (Signed)
Gynecology Annual Exam   PCP: Barbette Reichmann, MD  Chief Complaint:  Chief Complaint  Patient presents with  . Gynecologic Exam    History of Present Illness: Patient is a 47 y.o. G2P1011 presents for annual exam. The patient has no complaints today.   LMP: No LMP recorded. Patient has had a hysterectomy.  Vasomotor symptoms: is having mild vasomotor symptoms but reports these were actually more pronounced a few years ago.   No night sweats.  Does reports fatigue.    The patient is sexually active. She currently uses status post hysterectomy for contraception. She denies dyspareunia.  The patient does perform self breast exams.  There is no notable family history of breast or ovarian cancer in her family.  The patient wears seatbelts: yes.   The patient has regular exercise: not asked.    The patient denies current symptoms of depression.    Review of Systems: Review of Systems  Constitutional: Positive for malaise/fatigue. Negative for chills and fever.  HENT: Negative for congestion.   Respiratory: Negative for cough and shortness of breath.   Cardiovascular: Negative for chest pain and palpitations.  Gastrointestinal: Negative for abdominal pain, constipation, diarrhea, heartburn, nausea and vomiting.  Genitourinary: Negative for dysuria, frequency and urgency.  Skin: Negative for itching and rash.  Neurological: Negative for dizziness and headaches.  Endo/Heme/Allergies: Negative for polydipsia.  Psychiatric/Behavioral: Negative for depression.    Past Medical History:  Past Medical History:  Diagnosis Date  . Anxiety   . Depression   . 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 ABDOMINAL HYSTERECTOMY  2008  . BACK SURGERY  2010, 2013, 2014  . BREAST EXCISIONAL BIOPSY Right 08/13/2014   complex cyst excised done by byrnett     Gynecologic History:  No LMP recorded.  Patient has had a hysterectomy. Contraception: status post hysterectomy  Mammogram: 04/18/2017 BI-RAD II Last Pap: Results were:07/09/2014 NIL   Obstetric History: G2P1011  Family History:  Family History  Problem Relation Age of Onset  . Hypertension Mother   . Breast cancer Neg Hx     Social History:  Social History   Socioeconomic History  . Marital status: Married    Spouse name: Not on file  . Number of children: Not on file  . Years of education: 40  . Highest education level: Not on file  Occupational History  . Not on file  Social Needs  . Financial resource strain: Not on file  . Food insecurity:    Worry: Not on file    Inability: Not on file  . Transportation needs:    Medical: Not on file    Non-medical: Not on file  Tobacco Use  . Smoking status: Never Smoker  . Smokeless tobacco: Never Used  Substance and Sexual Activity  . Alcohol use: No    Alcohol/week: 0.0 standard drinks  . Drug use: No  . Sexual activity: Yes    Birth control/protection: Surgical    Comment: hysterectomy  Lifestyle  . Physical activity:    Days per week: Not on file    Minutes per session: Not on file  . Stress: Not on file  Relationships  . Social connections:    Talks on phone: Not on file    Gets together: Not on file    Attends religious service: Not on file    Active member of  club or organization: Not on file    Attends meetings of clubs or organizations: Not on file    Relationship status: Not on file  . Intimate partner violence:    Fear of current or ex partner: Not on file    Emotionally abused: Not on file    Physically abused: Not on file    Forced sexual activity: Not on file  Other Topics Concern  . Not on file  Social History Narrative  . Not on file    Allergies:  Allergies  Allergen Reactions  . Codeine Nausea And Vomiting    Medications: Prior to Admission medications   Medication Sig Start Date End Date Taking? Authorizing Provider    buPROPion (WELLBUTRIN XL) 150 MG 24 hr tablet Take 150 mg by mouth daily. 07/24/14   [provider]  DULoxetine (CYMBALTA) 60 MG capsule  07/27/14   [provider]  fluconazole (DIFLUCAN) 150 MG tablet Take one tablet every 3 days x 2 doses Patient not taking: Reported on 04/11/2017 12/20/16   Farrel Conners, CNM  ibuprofen (ADVIL,MOTRIN) 600 MG tablet Take 1 tablet (600 mg total) by mouth every 6 (six) hours as needed. 06/09/17   Dionne Bucy, MD  meloxicam (MOBIC) 7.5 MG tablet take 1 tablet by mouth once daily with food or milk 11/29/16   [provider]  metroNIDAZOLE (FLAGYL) 500 MG tablet Take 2 tabs BID for 1 day Patient not taking: Reported on 04/11/2017 12/08/16   Copland, Ilona Sorrel, PA-C  traZODone (DESYREL) 100 MG tablet Take 100 mg by mouth at bedtime.  08/12/14   [provider]    Physical Exam Vitals: Blood pressure 108/60, pulse 86, height 5\' 3"  (1.6 m), weight 123 lb (55.8 kg).   General: NAD HEENT: normocephalic, anicteric Thyroid: no enlargement, no palpable nodules Pulmonary: No increased work of breathing, CTAB Cardiovascular: RRR, distal pulses 2+ Breast: Breast symmetrical, no tenderness, no palpable nodules or masses, no skin or nipple retraction present, no nipple discharge.  No axillary or supraclavicular lymphadenopathy. Abdomen: NABS, soft, non-tender, non-distended.  Umbilicus without lesions.  No hepatomegaly, splenomegaly or masses palpable. No evidence of hernia  Genitourinary:  External: Normal external female genitalia.  Normal urethral meatus, normal Bartholin's and Skene's glands.    Vagina: Normal vaginal mucosa, no evidence of prolapse.    Cervix: surgically absent  Uterus: surgically absent  Adnexa: ovaries non-enlarged, no adnexal masses  Rectal: deferred  Lymphatic: no evidence of inguinal lymphadenopathy Extremities: no edema, erythema, or tenderness Neurologic: Grossly intact Psychiatric: mood  appropriate, affect full  Female chaperone present for pelvic and breast  portions of the physical exam    Assessment: 47 y.o. G2P1011 routine annual exam  Plan: Problem List Items Addressed This Visit    None    Visit Diagnoses    Breast screening    -  Primary   Relevant Orders   MM 3D SCREEN BREAST BILATERAL   Encounter for gynecological examination without abnormal finding       Relevant Orders   Vitamin D (25 hydroxy) (Completed)   CBC (Completed)   HEP, RPR, HIV Panel (Completed)   Cervicovaginal ancillary only   TSH (Completed)   Routine screening for STI (sexually transmitted infection)       Relevant Orders   HEP, RPR, HIV Panel (Completed)   Cervicovaginal ancillary only   Thyroid disorder screening       Relevant Orders   TSH (Completed)   Encounter for vitamin deficiency screening  Relevant Orders   Vitamin D (25 hydroxy) (Completed)   Other fatigue       Relevant Orders   Vitamin D (25 hydroxy) (Completed)   CBC (Completed)   TSH (Completed)      1) Mammogram - ordered today  2) STI screening  wasoffered and accepted  3)  ASCCP guidelines and rational discussed.  Patient opts for discontinue secondary to prior hysterectomy screening interval  4) Contraception - the patient is currently using  status post hysterectomy.  She is not currently in need of contraception secondary to being sterile  5) Routine healthcare maintenance including cholesterol, diabetes screening discussed Ordered today  6) Return in about 1 year (around 03/28/2019) for annual.   Vena Austria, MD, [facility], [facility] 03/28/2018, 4:08 PM

## 2018-03-27 NOTE — Patient Instructions (Signed)
[facility] [address] [place] [address]  [facility] [place]  [address] [place] [address]  Phone: [phone]  

## 2018-03-28 LAB — VITAMIN D 25 HYDROXY (VIT D DEFICIENCY, FRACTURES): Vit D, 25-Hydroxy: 32.9 ng/mL (ref 30.0–100.0)

## 2018-03-28 LAB — TSH: TSH: 1.26 u[IU]/mL (ref 0.450–4.500)

## 2018-03-28 LAB — CBC
Hematocrit: 41.2 % (ref 34.0–46.6)
Hemoglobin: 13.7 g/dL (ref 11.1–15.9)
MCH: 31.6 pg (ref 26.6–33.0)
MCHC: 33.3 g/dL (ref 31.5–35.7)
MCV: 95 fL (ref 79–97)
PLATELETS: 306 10*3/uL (ref 150–450)
RBC: 4.33 x10E6/uL (ref 3.77–5.28)
RDW: 12.9 % (ref 12.3–15.4)
WBC: 6.7 10*3/uL (ref 3.4–10.8)

## 2018-03-28 LAB — RPR, QUANT. (REFLEX): Rapid Plasma Reagin, Quant: 1:1 {titer} — ABNORMAL HIGH

## 2018-03-28 LAB — HEP, RPR, HIV PANEL
HIV SCREEN 4TH GENERATION: NONREACTIVE
Hepatitis B Surface Ag: NEGATIVE
RPR: REACTIVE — AB

## 2018-03-28 LAB — CERVICOVAGINAL ANCILLARY ONLY
Chlamydia: NEGATIVE
Neisseria Gonorrhea: NEGATIVE

## 2018-03-30 ENCOUNTER — Telehealth: Payer: Self-pay

## 2018-03-30 NOTE — Telephone Encounter (Signed)
Per Denyse Amass w/[facility] Pt needs TPPA order added to RPR lab since it was reactive. He would like a cb with results as he will not get results from [facility] if they are negative. cb [phone]

## 2018-03-31 NOTE — Telephone Encounter (Signed)
Patient is calling for results. Please advise [phone]

## 2018-04-03 ENCOUNTER — Telehealth: Payer: Self-pay

## 2018-04-03 NOTE — Telephone Encounter (Signed)
Pt is asking to have results given to her. 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 Please advise.Thank you!

## 2018-04-03 NOTE — Telephone Encounter (Signed)
Patient is calling for labs results. Please advise. 

## 2018-04-04 ENCOUNTER — Other Ambulatory Visit: Payer: Self-pay | Admitting: Obstetrics and Gynecology

## 2018-04-04 DIAGNOSIS — R768 Other specified abnormal immunological findings in serum: Secondary | ICD-10-CM

## 2018-04-04 DIAGNOSIS — A530 Latent syphilis, unspecified as early or late: Secondary | ICD-10-CM

## 2018-04-04 DIAGNOSIS — R5383 Other fatigue: Secondary | ICD-10-CM

## 2018-04-04 LAB — SPECIMEN STATUS REPORT

## 2018-04-04 LAB — T.PALLIDUM AB, TOTAL: T Pallidum Abs: NEGATIVE

## 2018-04-13 ENCOUNTER — Other Ambulatory Visit: Payer: Self-pay | Admitting: Obstetrics and Gynecology

## 2018-04-13 DIAGNOSIS — R519 Headache, unspecified: Secondary | ICD-10-CM

## 2018-04-13 DIAGNOSIS — M2550 Pain in unspecified joint: Secondary | ICD-10-CM

## 2018-04-13 DIAGNOSIS — R5383 Other fatigue: Secondary | ICD-10-CM

## 2018-04-13 DIAGNOSIS — R51 Headache: Principal | ICD-10-CM

## 2018-04-13 MED ORDER — DOXYCYCLINE HYCLATE 100 MG PO CAPS: 100.0000 mg | ORAL_CAPSULE | Freq: Two times a day (BID) | ORAL | 0 refills | Status: AC

## 2018-04-13 NOTE — Telephone Encounter (Signed)
Lab appointment needed

## 2018-04-13 NOTE — Progress Notes (Signed)
lyme

## 2018-04-18 ENCOUNTER — Other Ambulatory Visit: Payer: Self-pay

## 2018-04-18 DIAGNOSIS — R519 Headache, unspecified: Secondary | ICD-10-CM

## 2018-04-18 DIAGNOSIS — R5383 Other fatigue: Secondary | ICD-10-CM

## 2018-04-18 DIAGNOSIS — R51 Headache: Principal | ICD-10-CM

## 2018-04-18 DIAGNOSIS — M2550 Pain in unspecified joint: Secondary | ICD-10-CM

## 2018-04-19 ENCOUNTER — Other Ambulatory Visit: Payer: Self-pay | Admitting: Obstetrics and Gynecology

## 2018-04-19 LAB — LYME AB/WESTERN BLOT REFLEX: LYME DISEASE AB, QUANT, IGM: <0.8 index (ref 0.00–0.79)

## 2018-04-19 MED ORDER — VALACYCLOVIR HCL 1 G PO TABS: 1000.0000 mg | ORAL_TABLET | Freq: Every day | ORAL | 6 refills | Status: AC

## 2018-05-08 NOTE — Progress Notes (Deleted)
Office Visit Note  Patient: Sarah Petty             Date of Birth: 24-Mar-1971           MRN: [id number]             PCP: Barbette Reichmann, MD Referring: Vena Austria, MD Visit Date: 05/22/2018 Occupation: @GUAROCC @  Subjective:  No chief complaint on file.   History of Present Illness: Sarah Petty is a 47 y.o. female ***   Activities of Daily Living:  Patient reports morning stiffness for *** {minute/hour:19697}.   Patient {ACTIONS;DENIES/REPORTS:21021675::"Denies"} nocturnal pain.  Difficulty dressing/grooming: {ACTIONS;DENIES/REPORTS:21021675::"Denies"} Difficulty climbing stairs: {ACTIONS;DENIES/REPORTS:21021675::"Denies"} Difficulty getting out of chair: {ACTIONS;DENIES/REPORTS:21021675::"Denies"} Difficulty using hands for taps, buttons, cutlery, and/or writing: {ACTIONS;DENIES/REPORTS:21021675::"Denies"}  No Rheumatology ROS completed.   PMFS History:  Patient Active Problem List   Diagnosis Date Noted  . Left breast mass 04/11/2017  . Nipple lesion 08/14/2014  . Breast mass, right 07/31/2014    Past Medical History:  Diagnosis Date  . Anxiety   . Depression   . 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 Hypertension Mother   . Breast cancer Neg Hx    Past Surgical History:  Procedure Laterality Date  . ABDOMINAL HYSTERECTOMY  2008  . BACK SURGERY  2010, 2013, 2014  . BREAST EXCISIONAL BIOPSY Right 08/13/2014   complex cyst excised done by byrnett    Social History   Social History Narrative  . Not on file    Objective: Vital Signs: There were no vitals taken for this visit.   Physical Exam   Musculoskeletal Exam: ***  CDAI Exam: CDAI Score: Not documented Patient Global Assessment: Not documented; Provider Global Assessment: Not documented Swollen: Not documented; Tender: Not documented Joint Exam   Not documented   There is currently no information documented on the  homunculus. Go to the Rheumatology activity and complete the homunculus joint exam.  Investigation: Findings:  03/27/18: Hep B surface ag negative, RPR reactive, HIV nonreactive, T pallidum antibody negative, Rapid plasma reagin, quant 1.1  04/08/18: Lyme ab negative   Component     Latest Ref Rng & Units 03/27/2018 04/18/2018  Hepatitis B Surface Ag     Negative Negative   RPR     Non Reactive Reactive (A)   HIV Screen 4th Generation wRfx     Non Reactive Non Reactive   Lyme IgG/IgM Ab     0.00 - 0.90 ISR  <0.91  LYME DISEASE AB, QUANT, IGM     0.00 - 0.79 index  <0.80  TSH     0.450 - 4.500 uIU/mL 1.260   Rapid Plasma Reagin, Quant     NonRea<1:1 1:1 (H)   T Pallidum Abs     Negative Negative   specimen status report      Comment    Imaging: No results found.  Recent Labs: Lab Results  Component Value Date   WBC 6.7 03/27/2018   HGB 13.7 03/27/2018   PLT 306 03/27/2018   NA 135 06/09/2017   K 3.7 06/09/2017   CL 101 06/09/2017   CO2 21 (L) 06/09/2017   GLUCOSE 107 (H) 06/09/2017   BUN 14 06/09/2017   CREATININE 0.78 06/09/2017   BILITOT 0.9 06/09/2017   ALKPHOS 80 06/09/2017   AST 27 06/09/2017   ALT 12 (L) 06/09/2017   PROT 7.8 06/09/2017   ALBUMIN 4.5 06/09/2017  CALCIUM 9.7 06/09/2017   GFRAA >60 06/09/2017    Speciality Comments: No specialty comments available.  Procedures:  No procedures performed Allergies: Codeine   Assessment / Plan:     Visit Diagnoses: Biological false positive RPR test  Other fatigue  Anxiety and depression   Orders: No orders of the defined types were placed in this encounter.  No orders of the defined types were placed in this encounter.   Face-to-face time spent with patient was *** minutes. Greater than 50% of time was spent in counseling and coordination of care.  Follow-Up Instructions: No follow-ups on file.   Gearldine Bienenstockaylor M Dale, PA-C  Note - This record has been created using Dragon software.  Chart  creation errors have been sought, but may not always  have been located. Such creation errors do not reflect on  the standard of medical care.

## 2018-05-22 ENCOUNTER — Ambulatory Visit: Payer: Self-pay | Admitting: Rheumatology

## 2018-06-19 ENCOUNTER — Other Ambulatory Visit: Payer: Self-pay | Admitting: Obstetrics and Gynecology

## 2018-06-26 NOTE — Telephone Encounter (Signed)
[facility] refilled all three of her meds.  Pharm isn't refilling trazadone.  Pt can't sleep without it.  [phone]

## 2018-06-28 ENCOUNTER — Ambulatory Visit: Payer: Self-pay | Admitting: Rheumatology

## 2018-07-13 ENCOUNTER — Other Ambulatory Visit: Payer: Self-pay | Admitting: Obstetrics and Gynecology

## 2018-07-14 ENCOUNTER — Telehealth: Payer: Self-pay

## 2018-07-14 NOTE — Telephone Encounter (Signed)
Pt calling for refills on trazadone, duloxetine, and bupropion to be sent to [facility].  No refills on file at pharm.  [phone]

## 2018-07-14 NOTE — Telephone Encounter (Signed)
Please call and make pt aware medications were denied this morning at pharmacy due to patient has not been seen since 03/27/2018. She needs a follow up appointment with [facility] to make sure dosages do not need changed.

## 2018-07-14 NOTE — Telephone Encounter (Signed)
Mailbox full and not accepting msgs at this time.

## 2018-07-20 NOTE — Telephone Encounter (Signed)
Pt hasn't returned call.  Msg closed. 

## 2018-07-25 ENCOUNTER — Other Ambulatory Visit: Payer: Self-pay | Admitting: Obstetrics and Gynecology

## 2018-07-26 NOTE — Telephone Encounter (Signed)
Pt has not been seen since 03/2018. Please advise

## 2018-10-18 ENCOUNTER — Other Ambulatory Visit: Payer: Self-pay | Admitting: Obstetrics and Gynecology

## 2018-11-23 ENCOUNTER — Other Ambulatory Visit: Payer: Self-pay | Admitting: Obstetrics and Gynecology

## 2019-03-16 ENCOUNTER — Other Ambulatory Visit: Payer: Self-pay | Admitting: Obstetrics and Gynecology

## 2019-05-09 ENCOUNTER — Other Ambulatory Visit: Payer: Self-pay | Admitting: *Deleted

## 2019-05-09 DIAGNOSIS — Z20822 Contact with and (suspected) exposure to covid-19: Secondary | ICD-10-CM

## 2019-05-11 LAB — NOVEL CORONAVIRUS, NAA: SARS-CoV-2, NAA: DETECTED — AB

## 2019-06-04 ENCOUNTER — Telehealth: Payer: Self-pay

## 2019-06-04 NOTE — Telephone Encounter (Signed)
Spoke w/patient to notify [facility] out of office til tomorrow. She has 2 pills left and ok to wait.

## 2019-06-04 NOTE — Telephone Encounter (Signed)
Patient requesting refill of Buproprion originally rx'd by her previous physician. Pharmacy unable to sent request to [facility] for this reason. Patient requesting [facility] to send rx [facility] [place]. Cb#[phone]

## 2019-06-05 ENCOUNTER — Other Ambulatory Visit: Payer: Self-pay | Admitting: Obstetrics and Gynecology

## 2019-06-05 MED ORDER — BUPROPION HCL ER (XL) 150 MG PO TB24: 150.0000 mg | ORAL_TABLET | Freq: Every day | ORAL | 3 refills | Status: DC

## 2019-06-05 NOTE — Telephone Encounter (Signed)
Rx has been sent  

## 2019-06-05 NOTE — Telephone Encounter (Signed)
Patient aware.

## 2019-07-20 ENCOUNTER — Other Ambulatory Visit: Payer: Self-pay | Admitting: Obstetrics and Gynecology

## 2019-07-20 NOTE — Telephone Encounter (Signed)
Advise

## 2019-07-23 ENCOUNTER — Other Ambulatory Visit: Payer: Self-pay | Admitting: Obstetrics and Gynecology

## 2020-03-26 ENCOUNTER — Other Ambulatory Visit: Payer: Self-pay | Admitting: Obstetrics and Gynecology

## 2020-03-26 NOTE — Telephone Encounter (Signed)
Advise

## 2020-04-29 ENCOUNTER — Other Ambulatory Visit: Payer: Self-pay | Admitting: Obstetrics and Gynecology

## 2020-07-25 ENCOUNTER — Other Ambulatory Visit: Payer: Self-pay | Admitting: Obstetrics and Gynecology

## 2020-07-25 NOTE — Telephone Encounter (Signed)
Pt has not been seen since 04/13/18. Please advise.

## 2020-10-20 ENCOUNTER — Telehealth: Payer: Self-pay

## 2020-10-20 NOTE — Telephone Encounter (Signed)
Pt calling;has moved to [place], [place]; [facility] had a bus come to her school to do mammograms; she is being called back for u/s and 3D mammogram; could we send previous mammogram results? [address] [place], [place]  [phone]  Pt's # [phone] after 4pm ok to lm.  Pt aware [facility] should be able to see her records in Care Everywhere; if not she will need to fill out a release of information there; they will send it here and records will be sent.
